# Patient Record
Sex: Female | Born: 1970 | Race: Black or African American | Hispanic: No | Smoking: Never smoker
Health system: Southern US, Community
[De-identification: ages and names within clinical notes are randomized; demographics above are authoritative.]

## PROBLEM LIST (undated history)

## (undated) DIAGNOSIS — H409 Unspecified glaucoma: Secondary | ICD-10-CM

## (undated) DIAGNOSIS — D649 Anemia, unspecified: Secondary | ICD-10-CM

## (undated) DIAGNOSIS — E739 Lactose intolerance, unspecified: Secondary | ICD-10-CM

## (undated) DIAGNOSIS — R0602 Shortness of breath: Secondary | ICD-10-CM

## (undated) DIAGNOSIS — Z9289 Personal history of other medical treatment: Secondary | ICD-10-CM

## (undated) DIAGNOSIS — R569 Unspecified convulsions: Secondary | ICD-10-CM

## (undated) DIAGNOSIS — R5383 Other fatigue: Secondary | ICD-10-CM

## (undated) HISTORY — DX: Shortness of breath: R06.02

## (undated) HISTORY — DX: Unspecified convulsions: R56.9

## (undated) HISTORY — DX: Lactose intolerance, unspecified: E73.9

## (undated) HISTORY — DX: Unspecified glaucoma: H40.9

## (undated) HISTORY — DX: Personal history of other medical treatment: Z92.89

## (undated) HISTORY — DX: Other fatigue: R53.83

## (undated) HISTORY — DX: Anemia, unspecified: D64.9

## (undated) HISTORY — PX: CHOLECYSTECTOMY: SHX55

---

## 2001-04-10 HISTORY — PX: BREAST ENHANCEMENT SURGERY: SHX7

## 2008-10-30 ENCOUNTER — Encounter (INDEPENDENT_AMBULATORY_CARE_PROVIDER_SITE_OTHER): Payer: Self-pay | Admitting: Obstetrics and Gynecology

## 2008-10-30 ENCOUNTER — Ambulatory Visit (HOSPITAL_COMMUNITY): Admission: RE | Admit: 2008-10-30 | Discharge: 2008-10-30 | Payer: Self-pay | Admitting: Obstetrics and Gynecology

## 2010-07-17 LAB — BASIC METABOLIC PANEL
CO2: 27 mEq/L (ref 19–32)
GFR calc non Af Amer: >60 mL/min (ref >60)
Glucose, Bld: 86 mg/dL (ref 70–99)
Potassium: 4 mEq/L (ref 3.5–5.1)
Sodium: 139 mEq/L (ref 135–145)

## 2010-07-17 LAB — CBC
HCT: 35.4 % — ABNORMAL LOW (ref 36.0–46.0)
Hemoglobin: 11.9 g/dL — ABNORMAL LOW (ref 12.0–15.0)
MCHC: 33.5 g/dL (ref 30.0–36.0)
RDW: 15.3 % (ref 11.5–15.5)

## 2010-07-17 LAB — APTT: aPTT: 30 seconds (ref 24–37)

## 2010-08-23 NOTE — Op Note (Signed)
NAME:  Beth Morgan, Beth Morgan NO.:  [id number]   MEDICAL RECORD NO.:  [id number]          PATIENT TYPE:  AMB   LOCATION:  SDC                           FACILITY:  [facility]   PHYSICIAN:  Miguel Aschoff, M.D.       DATE OF BIRTH:  [birth year]   DATE OF PROCEDURE:  10/30/2008  DATE OF DISCHARGE:  10/30/2008                               OPERATIVE REPORT   PREOPERATIVE DIAGNOSIS:  Menorrhagia.   POSTOPERATIVE DIAGNOSIS:  Menorrhagia.   PROCEDURE:  Cervical dilatation, hysteroscopy, uterine curettage,  followed by NovaSure endometrial ablation.   SURGEON:  Miguel Aschoff, MD   ANESTHESIA:  General.   COMPLICATIONS:  None.   JUSTIFICATION:  The patient is a 40 year old black female with history  of progressively heavier menses, now interfering with ability to carry  out with normal routines of life.  Because of this heavy bleeding, she  has requested the procedure to be carried out and effort to control the  flow and after going counseling to the risks and benefits of endometrial  ablation versus other options of medical therapy, the patient has  elected to proceed with endometrial ablation.  The risks and benefits  have been discussed.  Informed consent has been obtained.   DESCRIPTION OF PROCEDURE:  The patient was taken to the operating room,  placed in supine position, general anesthesia was administered without  difficulty.  Once this was done, she was placed in dorsal lithotomy  position, prepped and draped in the usual sterile fashion.  Once this  was done, examination under anesthesia was carried out, which revealed  normal external genitalia, normal Bartholin and Skene glands, normal  urethra, and the vaginal vault was without gross lesion.  The cervix was  without gross lesion.  Uterus noted be anterior, normal size, and shape.  No adnexal masses were noted.  The bladder was previously catheterized.  Once this was done, a speculum was placed in the vaginal vault.  Anterior cervical lip was grasped with tenaculum and then the uterus was  sounded to 9 cm.  Cervical length was then determined to be 4 cm for  cavity length of 5.  At this point, further dilatation was carried out  using serial Pratt dilators until a #23 Pratt dilator could be passed.  Once this was done, the diagnostic hysteroscope was advanced through the  endocervical canal.  There is no endocervical lesions were noted.  The  endometrial cavity was inspected.  There were no polyps or submucous  myomas noted.  The cavity appeared essentially within normal limits.  Tubal ostia appeared to be within normal limits.  At this point, the  hysteroscope was removed.  Vigorous sharp curettage was carried out.  The curettings were sent for histologic study.  The NovaSure endometrial  ablation unit was then placed into uterine cavity.  The array opened and  a cavity width of 3.6 cm was determined.  At this point, a treatment  cycle was carried out at 99 watts for 90 seconds.  On the completion of  the treatment cycle, the unit was removed intact.  The hysteroscope  was  replaced into uterine cavity and cavity appeared be completely  coagulated.  At this point, the hysteroscope was removed.  The cervix  was injected with 10 mL of 1% Xylocaine for analgesia.  The patient  reversed from anesthetic and taken to recovery room in satisfactory  condition. Estimated blood loss from the procedure was approximately 30  mL.   PLAN:  The patient to be discharged home.   MEDICATIONS FOR HOME:  1. Doxycycline 1 twice a day x3 days.  2. Darvocet-N 100 every 4 hours as need for pain.   The patient will be seen back in 4 weeks for followup examination.  She  is to call for any problems such as fever, pain, or heavy bleeding.      Miguel Aschoff, M.D.  Electronically Signed     AR/MEDQ  D:  10/30/2008  T:  10/31/2008  Job:  [id number]

## 2012-10-16 ENCOUNTER — Ambulatory Visit (INDEPENDENT_AMBULATORY_CARE_PROVIDER_SITE_OTHER): Payer: PRIVATE HEALTH INSURANCE | Admitting: Internal Medicine

## 2012-10-16 ENCOUNTER — Encounter: Payer: Self-pay | Admitting: Internal Medicine

## 2012-10-16 VITALS — BP 110/72 | HR 56 | Temp 97.9°F | Ht 64.0 in | Wt 220.0 lb

## 2012-10-16 DIAGNOSIS — Z131 Encounter for screening for diabetes mellitus: Secondary | ICD-10-CM

## 2012-10-16 DIAGNOSIS — Z13220 Encounter for screening for lipoid disorders: Secondary | ICD-10-CM

## 2012-10-16 DIAGNOSIS — Z0000 Encounter for general adult medical examination without abnormal findings: Secondary | ICD-10-CM

## 2012-10-16 DIAGNOSIS — Z130 Encounter for screening for diseases of the blood and blood-forming organs and certain disorders involving the immune mechanism: Secondary | ICD-10-CM

## 2012-10-16 DIAGNOSIS — E669 Obesity, unspecified: Secondary | ICD-10-CM | POA: Insufficient documentation

## 2012-10-16 NOTE — Progress Notes (Signed)
HPI  Pt presents to the clinic today to establish care. She has not had a PCP in the last few years secondary to loss of insurance coverage. She has no concerns today.  Flu: never Tetanus: not UTD Eye doctor: annually Dentist: annually LMP: ablation 2011 Pap smear: 2011 Mammogram: never  Past Medical History  Diagnosis Date  . Seizures   . History of blood transfusion     No current outpatient prescriptions on file.   No current facility-administered medications for this visit.    No Known Allergies  Family History  Problem Relation Age of Onset  . Drug abuse Mother   . Asthma Maternal Aunt   . Bipolar disorder Maternal Aunt   . Drug abuse Maternal Uncle   . Fibromyalgia Maternal Uncle   . Bipolar disorder Maternal Uncle   . Throat cancer Maternal Grandmother   . Lung cancer Maternal Grandmother   . Throat cancer Maternal Grandfather   . Lung cancer Maternal Grandfather   . Hypertension Maternal Grandfather     History   Social History  . Marital Status: Single    Spouse Name: N/A    Number of Children: 2  . Years of Education: 12+   Occupational History  . [employer]    Social History Main Topics  . Smoking status: Never Smoker   . Smokeless tobacco: Never Used  . Alcohol Use: Yes  . Drug Use: No  . Sexually Active: Not on file   Other Topics Concern  . Not on file   Social History Narrative   Regular exercise-yes   Caffeine Use-yes    ROS:  Constitutional: Denies fever, malaise, fatigue, headache or abrupt weight changes.  HEENT: Denies eye pain, eye redness, ear pain, ringing in the ears, wax buildup, runny nose, nasal congestion, bloody nose, or sore throat. Respiratory: Denies difficulty breathing, shortness of breath, cough or sputum production.   Cardiovascular: Denies chest pain, chest tightness, palpitations or swelling in the hands or feet.  Gastrointestinal: Denies abdominal pain, bloating, constipation, diarrhea or blood in the stool.   GU: Denies frequency, urgency, pain with urination, blood in urine, odor or discharge. Musculoskeletal: Denies decrease in range of motion, difficulty with gait, muscle pain or joint pain and swelling.  Skin: Denies redness, rashes, lesions or ulcercations.  Neurological: Denies dizziness, difficulty with memory, difficulty with speech or problems with balance and coordination.   No other specific complaints in a complete review of systems (except as listed in HPI above).  PE:  BP 110/72  Pulse 56  Temp(Src) 97.9 F (36.6 C) (Oral)  Ht 5\' 4"  (1.626 m)  Wt 220 lb (99.791 kg)  BMI 37.74 kg/m2  SpO2 99% Wt Readings from Last 3 Encounters:  10/16/12 220 lb (99.791 kg)    General: Appears her stated age, obese but well developed, well nourished in NAD. HEENT: Head: normal shape and size; Eyes: sclera white, no icterus, conjunctiva pink, PERRLA and EOMs intact; Ears: Tm's gray and intact, normal light reflex; Nose: mucosa pink and moist, septum midline; Throat/Mouth: Teeth present, mucosa pink and moist, no lesions or ulcerations noted.  Neck: Normal range of motion. Neck supple, trachea midline. No massses, lumps or thyromegaly present.  Cardiovascular: Normal rate and rhythm. S1,S2 noted.  No murmur, rubs or gallops noted. No JVD or BLE edema. No carotid bruits noted. Pulmonary/Chest: Normal effort and positive vesicular breath sounds. No respiratory distress. No wheezes, rales or ronchi noted.  Abdomen: Soft and nontender. Normal bowel sounds,  no bruits noted. No distention or masses noted. Liver, spleen and kidneys non palpable. Musculoskeletal: Normal range of motion. No signs of joint swelling. No difficulty with gait.  Neurological: Alert and oriented. Cranial nerves II-XII intact. Coordination normal. +DTRs bilaterally. Psychiatric: Mood and affect normal. Behavior is normal. Judgment and thought content normal.      Assessment and Plan:  Preventative Health Maintenance:  Pt  declines tdap today Pap smear scheduled for next month Will have mammogram ordered through gyn Start to work on diet and exercise Will obtain labs today

## 2012-10-16 NOTE — Assessment & Plan Note (Signed)
Encouraged pt to download and utilize myfitnesspal app If not effective, RTC in 2 month to discuss other weight loss modalaties

## 2012-10-16 NOTE — Patient Instructions (Signed)

## 2012-10-26 LAB — CBC AND DIFFERENTIAL
HCT: 36 % (ref 36–46)
Hemoglobin: 11.7 g/dL — AB (ref 12.0–16.0)

## 2012-10-26 LAB — HEPATIC FUNCTION PANEL
Alkaline Phosphatase: 67 U/L (ref 25–125)
Bilirubin, Total: 0.4 mg/dL

## 2012-10-26 LAB — LIPID PANEL
HDL: 55 mg/dL (ref 35–70)
LDL Cholesterol: 120 mg/dL
Triglycerides: 113 mg/dL (ref 40–160)

## 2012-10-26 LAB — BASIC METABOLIC PANEL: Glucose: 84 mg/dL

## 2012-11-22 ENCOUNTER — Other Ambulatory Visit: Payer: Self-pay | Admitting: Obstetrics & Gynecology

## 2012-11-26 ENCOUNTER — Encounter: Payer: Self-pay | Admitting: Internal Medicine

## 2012-11-28 ENCOUNTER — Other Ambulatory Visit: Payer: Self-pay | Admitting: Obstetrics & Gynecology

## 2012-11-28 DIAGNOSIS — R928 Other abnormal and inconclusive findings on diagnostic imaging of breast: Secondary | ICD-10-CM

## 2012-12-25 ENCOUNTER — Ambulatory Visit
Admission: RE | Admit: 2012-12-25 | Discharge: 2012-12-25 | Disposition: A | Discharge status: Home / Self Care | Payer: PRIVATE HEALTH INSURANCE | Source: Ambulatory Visit | Attending: Obstetrics & Gynecology | Admitting: Obstetrics & Gynecology

## 2012-12-25 DIAGNOSIS — R928 Other abnormal and inconclusive findings on diagnostic imaging of breast: Secondary | ICD-10-CM

## 2014-07-26 IMAGING — MG MM DIANOSTIC UNILATERAL R
2 series · 2 of 2 positions shown · non-contrast
Comparison: None.

CLINICAL DATA: The patient returns for evaluation of a possible
asymmetry in the right upper outer quadrant posteriorly.

DIGITAL DIAGNOSTIC RIGHT MAMMOGRAM

[R CC]
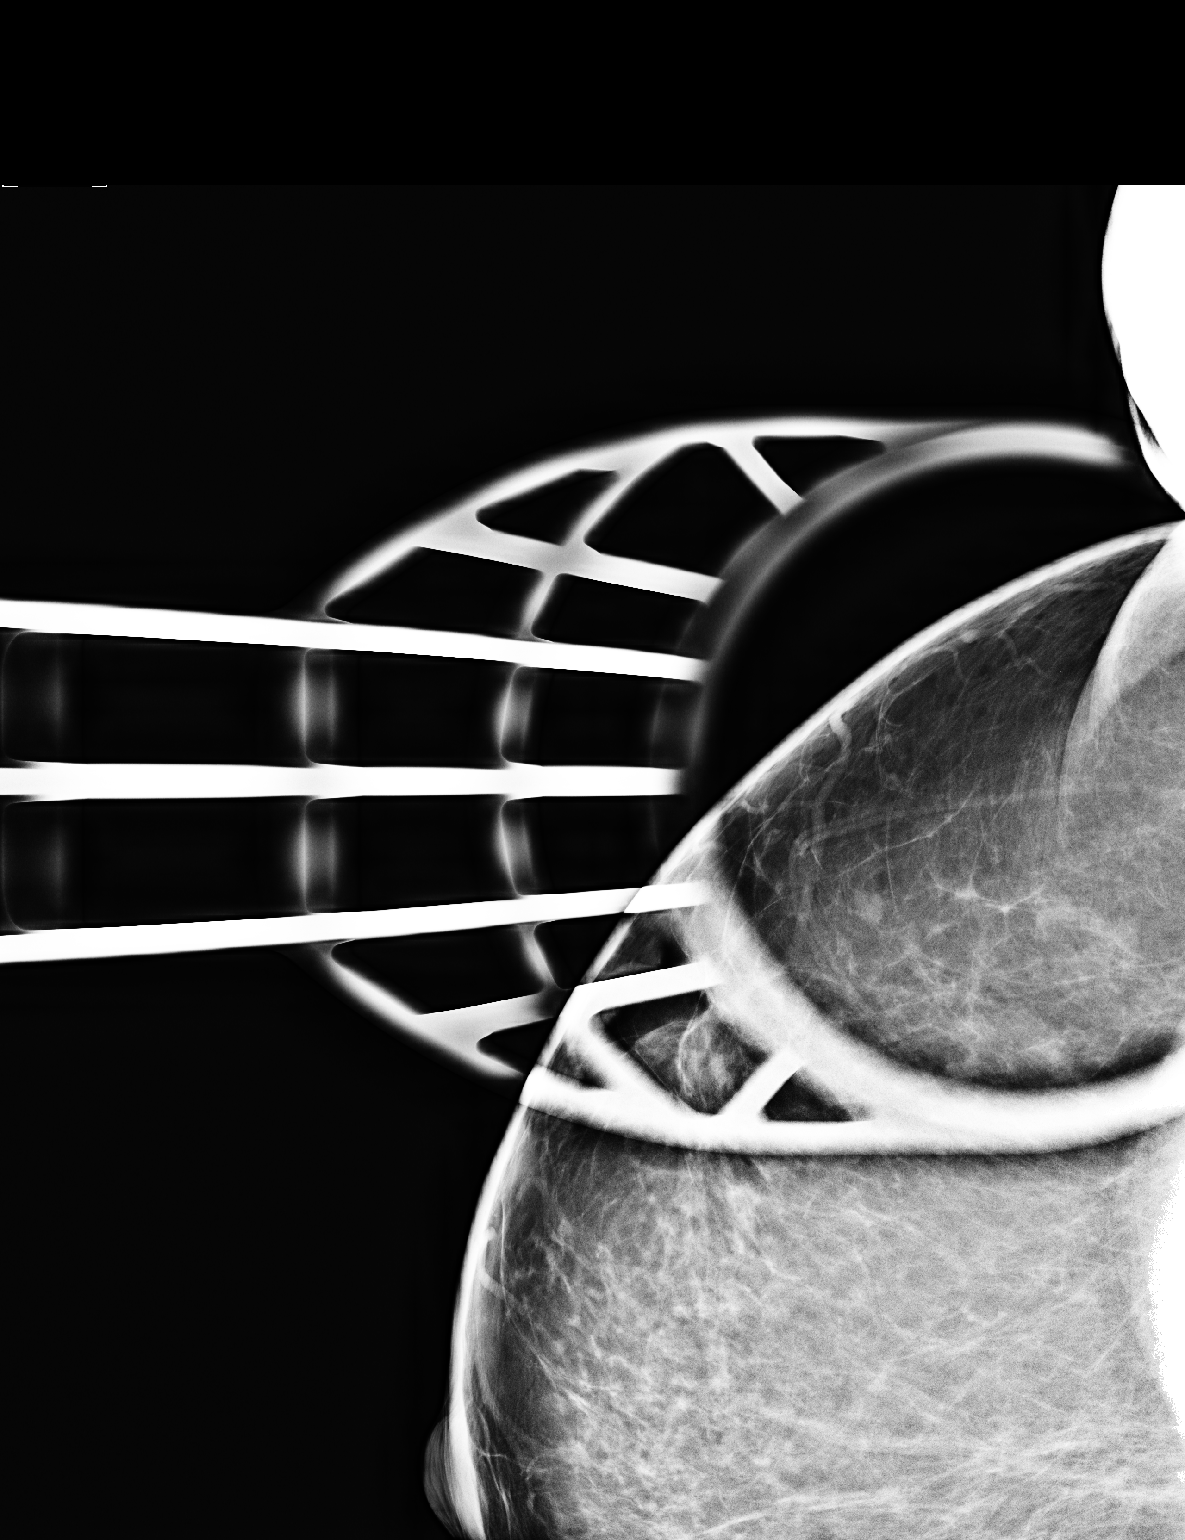

[R MLO]
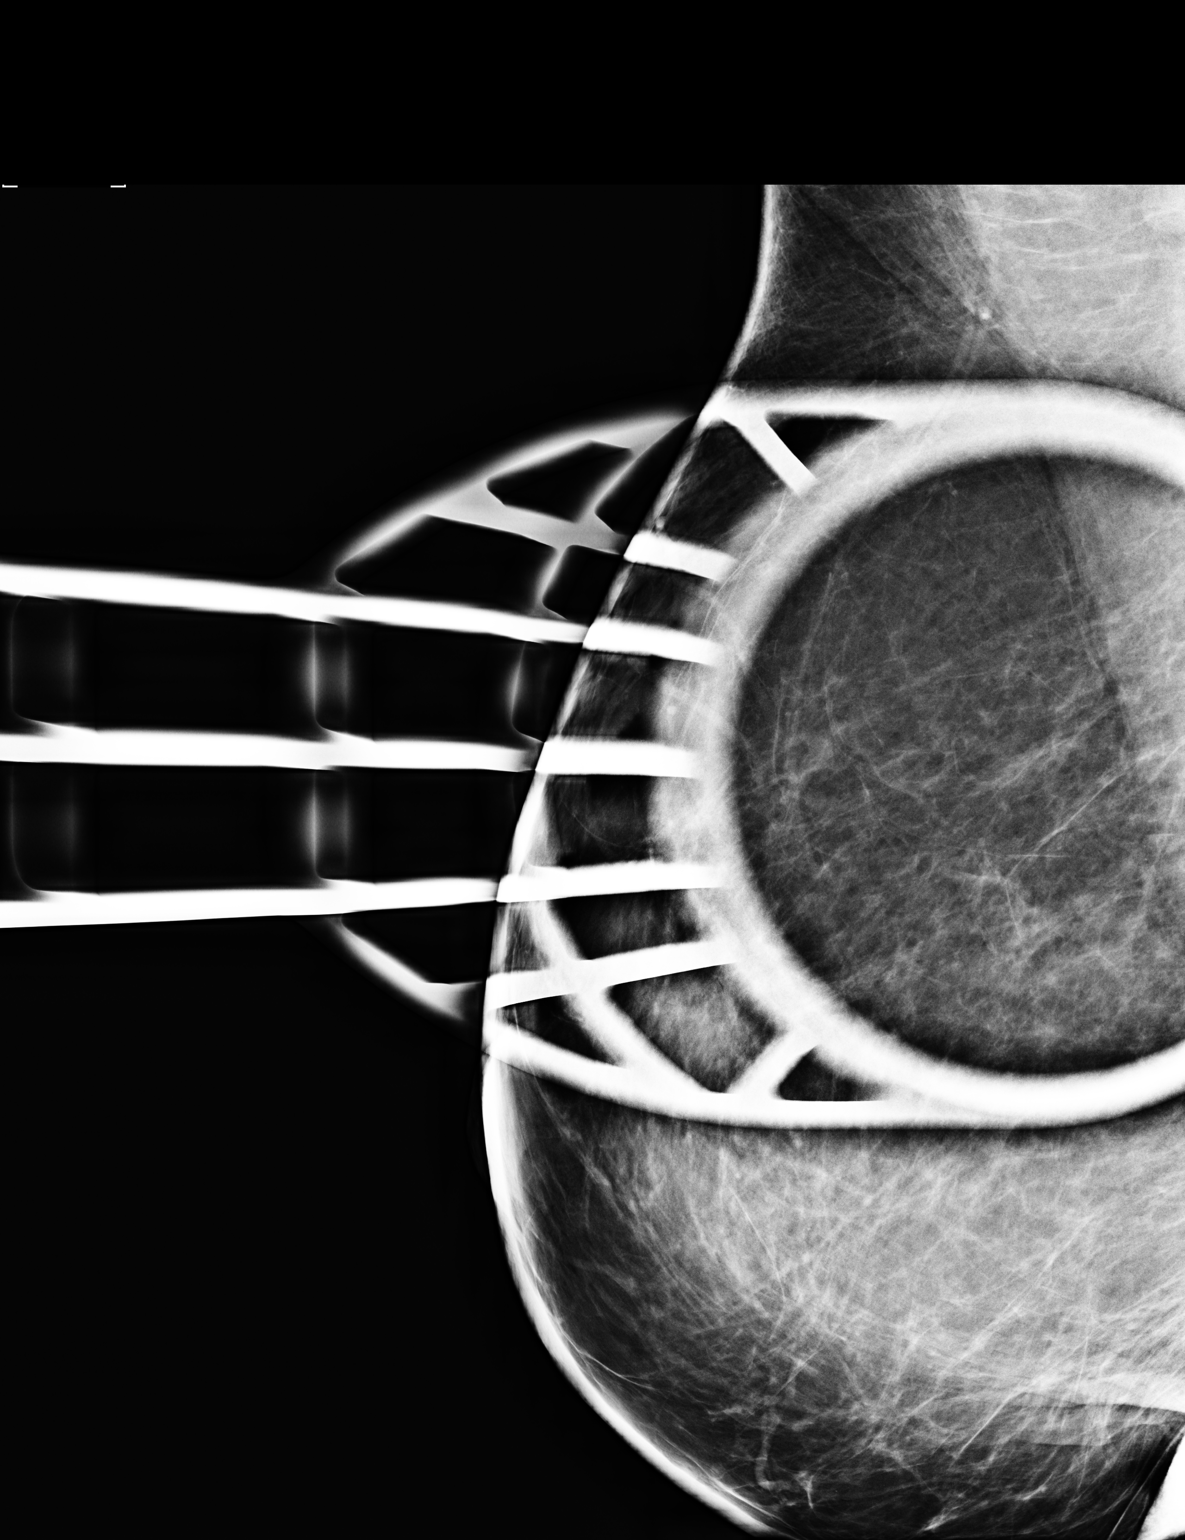

[2 of 2 positions shown; findings below may reference images not displayed]

FINDINGS: ACR Breast Density Category a:  The breast tissue is almost
entirely fatty.

Additional views demonstrate no persistent worrisome mass or
distortion of the right upper outer quadrant.  The initial finding
is felt to be due to reduction mammoplasty change.
IMPRESSION: No persistent worrisome abnormality upon additional imaging of the
right breast.

RECOMMENDATION:
Yearly screening mammography is suggested.

I have discussed the findings and recommendations with the patient.
Results were also provided in writing at the conclusion of the
visit.  If applicable, a reminder letter will be sent to the
patient regarding her next appointment.

BI-RADS CATEGORY 1:  Negative.

## 2015-04-06 ENCOUNTER — Other Ambulatory Visit: Payer: Self-pay | Admitting: Obstetrics & Gynecology

## 2015-04-09 LAB — CYTOLOGY - PAP

## 2019-07-18 ENCOUNTER — Ambulatory Visit: Payer: PRIVATE HEALTH INSURANCE | Attending: Internal Medicine

## 2019-07-18 DIAGNOSIS — Z23 Encounter for immunization: Secondary | ICD-10-CM

## 2019-07-18 NOTE — Progress Notes (Signed)
   Covid-19 Vaccination Clinic  Name:  Beth Morgan    MRN: [id number] DOB: February 27, 1971  07/18/2019  [name] was observed post Covid-19 immunization for 15 minutes without incident. She was provided with Vaccine Information Sheet and instruction to access the V-Safe system.   [name] was instructed to call 911 with any severe reactions post vaccine: Marland Kitchen Difficulty breathing  . Swelling of face and throat  . A fast heartbeat  . A bad rash all over body  . Dizziness and weakness   Immunizations Administered    Name Date Dose VIS Date Route   Pfizer COVID-19 Vaccine 07/18/2019  4:31 PM 0.3 mL 03/21/2019 Intramuscular   Manufacturer: [employer], [employer]   Lot: [id number]   NDC: [address]-6

## 2019-08-11 ENCOUNTER — Ambulatory Visit: Payer: PRIVATE HEALTH INSURANCE | Attending: Internal Medicine

## 2019-08-11 DIAGNOSIS — Z23 Encounter for immunization: Secondary | ICD-10-CM

## 2019-08-11 NOTE — Progress Notes (Signed)
   Covid-19 Vaccination Clinic  Name:  SACORA HAWBAKER    MRN: [id number] DOB: [birth year]  08/11/2019  Ms. Ellington was observed post Covid-19 immunization for 15 minutes without incident. She was provided with Vaccine Information Sheet and instruction to access the V-Safe system.   Ms. Kindley was instructed to call 911 with any severe reactions post vaccine: Marland Kitchen Difficulty breathing  . Swelling of face and throat  . A fast heartbeat  . A bad rash all over body  . Dizziness and weakness   Immunizations Administered    Name Date Dose VIS Date Route   Pfizer COVID-19 Vaccine 08/11/2019  4:33 PM 0.3 mL 06/04/2018 Intramuscular   Manufacturer: [employer], [employer]   Lot: [id number]   NDC: [address]-2

## 2021-03-21 DIAGNOSIS — Z0289 Encounter for other administrative examinations: Secondary | ICD-10-CM

## 2021-04-19 ENCOUNTER — Other Ambulatory Visit: Payer: Self-pay

## 2021-04-19 ENCOUNTER — Ambulatory Visit (INDEPENDENT_AMBULATORY_CARE_PROVIDER_SITE_OTHER): Payer: Managed Care, Other (non HMO) | Admitting: Family Medicine

## 2021-04-19 ENCOUNTER — Encounter (INDEPENDENT_AMBULATORY_CARE_PROVIDER_SITE_OTHER): Payer: Self-pay | Admitting: Family Medicine

## 2021-04-19 VITALS — BP 125/84 | HR 64 | Temp 98.2°F | Ht 64.0 in | Wt 229.0 lb

## 2021-04-19 DIAGNOSIS — E739 Lactose intolerance, unspecified: Secondary | ICD-10-CM

## 2021-04-19 DIAGNOSIS — E66812 Obesity, class 2: Secondary | ICD-10-CM

## 2021-04-19 DIAGNOSIS — Z1331 Encounter for screening for depression: Secondary | ICD-10-CM

## 2021-04-19 DIAGNOSIS — F5089 Other specified eating disorder: Secondary | ICD-10-CM

## 2021-04-19 DIAGNOSIS — Z9189 Other specified personal risk factors, not elsewhere classified: Secondary | ICD-10-CM

## 2021-04-19 DIAGNOSIS — E8881 Metabolic syndrome: Secondary | ICD-10-CM | POA: Diagnosis not present

## 2021-04-19 DIAGNOSIS — E88819 Insulin resistance, unspecified: Secondary | ICD-10-CM

## 2021-04-19 DIAGNOSIS — Z6839 Body mass index (BMI) 39.0-39.9, adult: Secondary | ICD-10-CM

## 2021-04-19 DIAGNOSIS — E559 Vitamin D deficiency, unspecified: Secondary | ICD-10-CM

## 2021-04-19 DIAGNOSIS — D649 Anemia, unspecified: Secondary | ICD-10-CM

## 2021-04-19 DIAGNOSIS — R0602 Shortness of breath: Secondary | ICD-10-CM

## 2021-04-19 DIAGNOSIS — R5383 Other fatigue: Secondary | ICD-10-CM | POA: Diagnosis not present

## 2021-04-19 DIAGNOSIS — E785 Hyperlipidemia, unspecified: Secondary | ICD-10-CM | POA: Diagnosis not present

## 2021-04-19 DIAGNOSIS — E669 Obesity, unspecified: Secondary | ICD-10-CM

## 2021-04-20 LAB — CBC WITH DIFFERENTIAL/PLATELET
Basophils Absolute: 0.1 10*3/uL (ref 0.0–0.2)
Basos: 1 %
EOS (ABSOLUTE): 0.1 10*3/uL (ref 0.0–0.4)
Eos: 1 %
Hematocrit: 39.1 % (ref 34.0–46.6)
Hemoglobin: 12.3 g/dL (ref 11.1–15.9)
Immature Grans (Abs): 0 10*3/uL (ref 0.0–0.1)
Immature Granulocytes: 0 %
Lymphocytes Absolute: 2.8 10*3/uL (ref 0.7–3.1)
Lymphs: 41 %
MCH: 25.9 pg — ABNORMAL LOW (ref 26.6–33.0)
MCHC: 31.5 g/dL (ref 31.5–35.7)
MCV: 83 fL (ref 79–97)
Monocytes Absolute: 0.3 10*3/uL (ref 0.1–0.9)
Monocytes: 5 %
Neutrophils Absolute: 3.6 10*3/uL (ref 1.4–7.0)
Neutrophils: 52 %
Platelets: 275 10*3/uL (ref 150–450)
RBC: 4.74 x10E6/uL (ref 3.77–5.28)
RDW: 13.6 % (ref 11.7–15.4)
WBC: 6.9 10*3/uL (ref 3.4–10.8)

## 2021-04-20 LAB — LIPID PANEL WITH LDL/HDL RATIO
Cholesterol, Total: 228 mg/dL — ABNORMAL HIGH (ref 100–199)
HDL: 52 mg/dL (ref >39)
LDL Chol Calc (NIH): 158 mg/dL — ABNORMAL HIGH (ref 0–99)
LDL/HDL Ratio: 3 ratio (ref 0.0–3.2)
Triglycerides: 101 mg/dL (ref 0–149)
VLDL Cholesterol Cal: 18 mg/dL (ref 5–40)

## 2021-04-20 LAB — COMPREHENSIVE METABOLIC PANEL
ALT: 18 IU/L (ref 0–32)
AST: 22 IU/L (ref 0–40)
Albumin/Globulin Ratio: 1.7 (ref 1.2–2.2)
Albumin: 4.3 g/dL (ref 3.8–4.9)
Alkaline Phosphatase: 104 IU/L (ref 44–121)
BUN/Creatinine Ratio: 10 (ref 9–23)
BUN: 8 mg/dL (ref 6–24)
Bilirubin Total: 0.3 mg/dL (ref 0.0–1.2)
CO2: 24 mmol/L (ref 20–29)
Calcium: 9.4 mg/dL (ref 8.7–10.2)
Chloride: 104 mmol/L (ref 96–106)
Creatinine, Ser: 0.84 mg/dL (ref 0.57–1.00)
Globulin, Total: 2.5 g/dL (ref 1.5–4.5)
Glucose: 79 mg/dL (ref 70–99)
Potassium: 4.2 mmol/L (ref 3.5–5.2)
Sodium: 141 mmol/L (ref 134–144)
Total Protein: 6.8 g/dL (ref 6.0–8.5)
eGFR: 84 mL/min/{1.73_m2} (ref >59)

## 2021-04-20 LAB — VITAMIN B12: Vitamin B-12: 330 pg/mL (ref 232–1245)

## 2021-04-20 LAB — VITAMIN D 25 HYDROXY (VIT D DEFICIENCY, FRACTURES): Vit D, 25-Hydroxy: 5.9 ng/mL — ABNORMAL LOW (ref 30.0–100.0)

## 2021-04-20 LAB — HEMOGLOBIN A1C
Est. average glucose Bld gHb Est-mCnc: 117 mg/dL
Hgb A1c MFr Bld: 5.7 % — ABNORMAL HIGH (ref 4.8–5.6)

## 2021-04-20 LAB — FOLATE: Folate: 6.9 ng/mL (ref >3.0)

## 2021-04-20 LAB — INSULIN, RANDOM: INSULIN: 17 u[IU]/mL (ref 2.6–24.9)

## 2021-04-20 LAB — T4, FREE: Free T4: 1.24 ng/dL (ref 0.82–1.77)

## 2021-04-20 LAB — TSH: TSH: 2.73 u[IU]/mL (ref 0.450–4.500)

## 2021-04-24 NOTE — Progress Notes (Signed)
Chief Complaint:   OBESITY Hazle QuantDanica L Moline (MR# [id number]) is a 51 y.o. female who presents for evaluation and treatment of obesity and related comorbidities. Current BMI is Body mass index is 39.31 kg/m. Nicholes MangoDanica has been struggling with her weight for many years and has been unsuccessful in either losing weight, maintaining weight loss, or reaching her healthy weight goal.  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 She has an 52 year old daughter that lives with her. She desires an 80 lb weight loss in 1 year. She feels that eating out is her worst habit that led to her weight gain, as she eats out every day. She snacks on popcorn, chips, and cookies. She skips lunch 4 days per week. Her [facility] care [facility] [place], which I do not have any records from and they are not in the EMR.  Nicholes MangoDanica is currently in the action stage of change and ready to dedicate time achieving and maintaining a healthier weight. Nicholes MangoDanica is interested in becoming our patient and working on intensive lifestyle modifications including (but not limited to) diet and exercise for weight loss.  Jamar's habits were reviewed today and are as follows: Her family eats meals together, she thinks her family will eat healthier with her, her desired weight loss is 69 lbs, she has been heavy most of her life, she started gaining weight at 18 after her first child, her heaviest weight ever was 240 pounds, she snacks frequently in the evenings, she frequently makes poor food choices, she frequently eats larger portions than normal, and she struggles with emotional eating.  Depression Screen Jaylee's Food and Mood (modified PHQ-9) score was 8.  Depression screen PHQ 2/9 04/19/2021  Decreased Interest 1  Down, Depressed, Hopeless 1  PHQ - 2 Score 2  Altered sleeping 1  Tired, decreased energy 1  Change in appetite 1  Feeling bad or failure about yourself  1  Trouble  concentrating 0  Moving slowly or fidgety/restless 2  Suicidal thoughts 0  PHQ-9 Score 8  Difficult doing work/chores Not difficult at all   Subjective:   1. Other fatigue Deolinda admits to daytime somnolence and admits to waking up still tired. Patent has a history of symptoms of daytime fatigue and morning fatigue. Orvetta generally gets 7 or 8 hours of sleep per night, and states that she has nightime awakenings. Snoring is not present. Apneic episodes are not present. Epworth Sleepiness Score is 5.  2. Shortness of breath on exertion Leesa notes increasing shortness of breath with exercising and seems to be worsening over time with weight gain. She notes getting out of breath sooner with activity than she used to. This has not gotten worse recently. Tagan denies shortness of breath at rest or orthopnea.  3. Anemia, unspecified type Nakkia doesn't take any supplements but she was told to in the past.  4. Lactose intolerance Richele diagnosed herself. She notes milk and cheese causes her to have "bad gas", and occasional loose stool.  5. Hyperlipidemia, unspecified hyperlipidemia type Nicholes MangoDanica was told she had elevated LDL in the past. She has not been on medications in the past.  6. Insulin resistance Jolee's A1c was 5.6 eight years ago. She has no recent A1c, and she is not on medications.  7. Vitamin D deficiency Lakiyah was diagnosed years ago. She was told to take OTC Vit D, but she stopped. She never had it rechecked.  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 She claims this is not a current road block for her and she declines a referral to see Dr. Dewaine Conger. Her PHQ-9 score is only 8.   9. At risk for impaired metabolic function Ladana is at increased risk for impaired metabolic function due to obesity and insulin resistance.   Assessment/Plan:   Orders Placed This Encounter  Procedures   Hemoglobin  A1c   Folate   Comprehensive metabolic panel   CBC with Differential/Platelet   Vitamin B12   Insulin, random   Lipid Panel With LDL/HDL Ratio   T4, free   TSH   VITAMIN D 25 Hydroxy (Vit-D Deficiency, Fractures)   EKG 12-Lead    There are no discontinued medications.   No orders of the defined types were placed in this encounter.    1. Other fatigue Clelia does feel that her weight is causing her energy to be lower than it should be. Fatigue may be related to obesity, depression or many other causes. Labs will be ordered, and in the meanwhile, Adilene will focus on self care including making healthy food choices, increasing physical activity and focusing on stress reduction.  - EKG 12-Lead  2. Shortness of breath on exertion Sofija does feel that she gets out of breath more easily that she used to when she exercises. Ally's shortness of breath appears to be obesity related and exercise induced. She has agreed to work on weight loss and gradually increase exercise to treat her exercise induced shortness of breath. Will continue to monitor closely.  3. Anemia, unspecified type We will check labs today, and we will follow up at Narissa's next office visit. Orders and follow up as documented in patient record.  Counseling Iron is essential for our bodies to make red blood cells.  Reasons that someone may be deficient include: an iron-deficient diet (more likely in those following vegan or vegetarian diets), women with heavy menses, patients with GI disorders or poor absorption, patients that have had bariatric surgery, frequent blood donors, patients with cancer, and patients with heart disease.   Iron-rich foods include dark leafy greens, red and white meats, eggs, seafood, and beans.   Certain foods and drinks prevent your body from absorbing iron properly. Avoid eating these foods in the same meal as iron-rich foods or with iron supplements. These foods include: coffee, black tea, and  red wine; milk, dairy products, and foods that are high in calcium; beans and soybeans; whole grains.  Constipation can be a side effect of iron supplementation. Increased water and fiber intake are helpful. Water goal: > 2 liters/day. Fiber goal: > 25 grams/day.  - Folate - CBC with Differential/Platelet - Vitamin B12  4. Lactose intolerance I discussed with the patient lactose free milk, yogurts, cheese, and substitutions that she can make.  5. Hyperlipidemia, unspecified hyperlipidemia type Cardiovascular risk and specific lipid/LDL goals reviewed. We discussed several lifestyle modifications today. We will check labs today. Chaquetta will continue to work on diet, exercise and weight loss efforts. Orders and follow up as documented in patient record.   Counseling Intensive lifestyle modifications are the first line treatment for this issue. Dietary changes: Increase soluble fiber. Decrease simple carbohydrates. Exercise changes: Moderate to vigorous-intensity aerobic activity 150 minutes per week if tolerated. Lipid-lowering medications: see documented in medical record.  - Comprehensive metabolic panel - Lipid Panel With LDL/HDL Ratio  6. Insulin resistance Renatha will  continue to work on weight loss, exercise, and decreasing simple carbohydrates to help decrease the risk of diabetes. We will check labs today. Kennesha agreed to follow-up with [place] as directed to closely monitor her progress.  - Hemoglobin A1c - Insulin, random - T4, free - TSH  7. Vitamin D deficiency Low Vitamin D level contributes to fatigue and are associated with obesity, breast, and colon cancer. We will check labs today. Sidney will follow-up for routine testing of Vitamin D, at least 2-3 times per year to avoid over-replacement.  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 Orders and follow up as documented in patient record.   9. Screening for depression Alithia had a positive depression screening. Depression is commonly associated with obesity and often results in emotional eating behaviors. We will monitor this closely and work on CBT to help improve the non-hunger eating patterns. Referral to Psychology may be required if no improvement is seen as she continues in our clinic.  10. At risk for impaired metabolic function Aveen was given approximately 23 minutes of impaired  metabolic function prevention counseling today. We discussed intensive lifestyle modifications today with an emphasis on specific nutrition and exercise instructions and strategies.   Repetitive spaced learning was employed today to elicit superior memory formation and behavioral change.   11. Obesity with current BMI of 39.3 Aamilah is currently in the action stage of change and her goal is to continue with weight loss efforts. I recommend Abrianna begin the structured treatment plan as follows:  She has agreed to the Category 1 Plan.  Exercise goals: As is.   Behavioral modification strategies: increasing lean protein intake, decreasing simple carbohydrates, decreasing eating out, and planning for success.  She was informed of the importance of frequent follow-up visits to maximize her success with intensive lifestyle modifications for her multiple health conditions. She was informed we would discuss her lab results at her next visit unless there is a critical issue that needs to be addressed sooner. Arnesha agreed to keep her next visit at the agreed upon time to discuss these results.  Objective:   Blood pressure 125/84, pulse 64, temperature 98.2 F (36.8 C), height 5\' 4"  (1.626 m), weight 229 lb (103.9 kg), SpO2 99 %. Body mass index is 39.31 kg/m.  EKG: Normal sinus rhythm, rate 65 BPM.  Indirect Calorimeter completed today shows a VO2 of 229 and a REE of  1584.  Her calculated basal metabolic rate is thus her basal metabolic rate is worse than expected.  General: Cooperative, alert, well developed, in no acute distress. HEENT: Conjunctivae and lids unremarkable. Cardiovascular: Regular rhythm.  Lungs: Normal work of breathing. Neurologic: No focal deficits.   Lab Results  Component Value Date   CREATININE 0.84 04/19/2021   BUN 8 04/19/2021   NA 141 04/19/2021   K 4.2 04/19/2021   CL 104 04/19/2021   CO2 24 04/19/2021   Lab Results  Component Value Date   ALT 18 04/19/2021   AST 22 04/19/2021   ALKPHOS 104 04/19/2021   BILITOT 0.3 04/19/2021   Lab Results  Component Value Date   HGBA1C 5.7 (H) 04/19/2021   HGBA1C 5.6 10/26/2012   Lab Results  Component Value Date   INSULIN 17.0 04/19/2021   Lab Results  Component Value Date   TSH 2.730  04/19/2021   Lab Results  Component Value Date   CHOL 228 (H) 04/19/2021   HDL 52 04/19/2021   LDLCALC 158 (H) 04/19/2021   TRIG 101 04/19/2021   Lab Results  Component Value Date   WBC 6.9 04/19/2021   HGB 12.3 04/19/2021   HCT 39.1 04/19/2021   MCV 83 04/19/2021   PLT 275 04/19/2021   No results found for: IRON, TIBC, FERRITIN  Attestation Statements:   Reviewed by clinician on day of visit: allergies, medications, problem list, medical history, surgical history, family history, social history, and previous encounter notes.   Trude McburneyI, Sharon Martin, am acting as transcriptionist for Marsh & McLennanDeborah Norris Brumbach, DO  I have reviewed the above documentation for accuracy and completeness, and I agree with the above. Carlye Grippe- Shamarr Faucett J Cardell Rachel, D.O.  The 21st Century Cures Act was signed into law in 2016 which includes the topic of electronic health records.  This provides immediate access to information in MyChart.  This includes consultation notes, operative notes, office notes, lab results and pathology reports.  If you have any questions about what you read please let us know at your next  visit so we can discuss your concerns and take corrective action if need be.  We are right here with you.

## 2021-05-03 ENCOUNTER — Ambulatory Visit (INDEPENDENT_AMBULATORY_CARE_PROVIDER_SITE_OTHER): Payer: Managed Care, Other (non HMO) | Admitting: Family Medicine

## 2021-05-03 ENCOUNTER — Encounter (INDEPENDENT_AMBULATORY_CARE_PROVIDER_SITE_OTHER): Payer: Self-pay | Admitting: Family Medicine

## 2021-05-03 ENCOUNTER — Other Ambulatory Visit: Payer: Self-pay

## 2021-05-03 VITALS — BP 138/84 | HR 83 | Temp 98.3°F | Ht 64.0 in | Wt 230.0 lb

## 2021-05-03 DIAGNOSIS — E7849 Other hyperlipidemia: Secondary | ICD-10-CM

## 2021-05-03 DIAGNOSIS — E559 Vitamin D deficiency, unspecified: Secondary | ICD-10-CM

## 2021-05-03 DIAGNOSIS — Z9189 Other specified personal risk factors, not elsewhere classified: Secondary | ICD-10-CM

## 2021-05-03 DIAGNOSIS — D538 Other specified nutritional anemias: Secondary | ICD-10-CM

## 2021-05-03 DIAGNOSIS — R7303 Prediabetes: Secondary | ICD-10-CM | POA: Diagnosis not present

## 2021-05-03 DIAGNOSIS — E669 Obesity, unspecified: Secondary | ICD-10-CM

## 2021-05-03 DIAGNOSIS — Z6839 Body mass index (BMI) 39.0-39.9, adult: Secondary | ICD-10-CM

## 2021-05-03 MED ORDER — VITAMIN D (ERGOCALCIFEROL) 1.25 MG (50000 UNIT) PO CAPS: 50000.0000 [IU] | ORAL_CAPSULE | ORAL | 0 refills | Status: AC

## 2021-05-03 NOTE — Patient Instructions (Signed)
The 10-year ASCVD risk score (Arnett DK, et al., 2019) is: 3.8%   Values used to calculate the score:     Age: 51 years     Sex: Female     Is Non-Hispanic African American: Yes     Diabetic: No     Tobacco smoker: No     Systolic Blood Pressure: 138 mmHg     Is BP treated: No     HDL Cholesterol: 52 mg/dL     Total Cholesterol: 228 mg/dL

## 2021-05-04 NOTE — Progress Notes (Signed)
Chief Complaint:   OBESITY Beth Morgan is here to discuss her progress with her obesity treatment plan along with follow-up of her obesity related diagnoses. Beth Morgan is on the Category 1 Plan and states she is following her eating plan approximately 15% of the time. Beth Morgan states she is not currently exercising.  Today's visit was #: 2 Starting weight: 229 lbs Starting date: 04/19/2021 Today's weight: 230 lbs Today's date: 05/03/2021 Total lbs lost to date: 0 Total lbs lost since last in-office visit: +1  Interim History: Beth Morgan is here today for her first follow-up office visit since starting the program with [place].  All blood work/ lab tests that were recently ordered by myself or an outside provider were reviewed with patient today per their request.   Extended time was spent counseling her on all new disease processes that were discovered or preexisting ones that are affected by BMI.  she understands that many of these abnormalities will need to monitored regularly along with the current treatment plan of prudent dietary changes, in which we are making each and every office visit, to improve these health parameters. Pt hasn't been able to follow plan, as she traveled to [place], [place] and had her birthday. She hasn't shopped for food yet but plans to go this weekend. Pt only gained 1 lb over her birthday!  Subjective:   1. Other hyperlipidemia Discussed labs with patient today. Medication: None  The 10-year ASCVD risk score (Arnett DK, et al., 2019) is: 3.8%   Values used to calculate the score:     Age: 51 years     Sex: Female     Is Non-Hispanic African American: Yes     Diabetic: No     Tobacco smoker: No     Systolic Blood Pressure: 138 mmHg     Is BP treated: No     HDL Cholesterol: 52 mg/dL     Total Cholesterol: 228 mg/dL  2. Other specified nutritional anemias Discussed labs with patient today. Pt hasn't been on an iron supplement for months. She reports  fatigue.  3. Prediabetes New. Discussed labs with patient today. Pt has an elevated A1c for the first time and elevated fasting insulin to over 3 times its normal.  4. Vitamin D deficiency Discussed labs with patient today. She is currently taking no vitamin D supplement. She denies nausea, vomiting or muscle weakness.  5. At risk for deficient knowledge of diabetes mellitus Beth Morgan is at risk of deficient knowledge of diabetes mellitus due to history of insulin resistance that has now progressed to pre-diabetes.  Assessment/Plan:  No orders of the defined types were placed in this encounter.   There are no discontinued medications.   Meds ordered this encounter  Medications   Vitamin D, Ergocalciferol, (DRISDOL) 1.25 MG (50000 UNIT) CAPS capsule    Sig: Take 1 capsule (50,000 Units total) by mouth every 7 (seven) days.    Dispense:  4 capsule    Refill:  0    30 d supply;  ** OV for RF **   Do not send RF request     1. Other hyperlipidemia No need for meds at this time. Follow prudent nutritional plan, decrease sat/trans fats, and eventually increase exercise.  2. Other specified nutritional anemias Labs stable. No need for iron supplementation, however, iron rich foods are recommended.  3. Prediabetes Extensive counseling done and handouts provided. Follow prudent nutritional plan and decrease simple carbs. Repeat labs in 3 months.  4. Vitamin D deficiency Very poorly controlled. Plan: - Discussed importance of vitamin D to their health and well-being.  - possible symptoms of low Vitamin D can be low energy, depressed mood, muscle aches, joint aches, osteoporosis etc. - low Vitamin D levels may be linked to an increased risk of cardiovascular events and even increased risk of cancers- such as colon and breast.  - I recommend pt take a 50K IU weekly prescription vit D - see script below   - Informed patient this may be a lifelong thing, and she was encouraged to continue  to take the medicine until told otherwise.   - we will need to monitor levels regularly (every 3-4 mo on average) to keep levels within normal limits.  - weight loss will likely improve availability of vitamin D, thus encouraged Beth Morgan to continue with meal plan and their weight loss efforts to further improve this condition - pt's questions and concerns regarding this condition addressed.  Start- Vitamin D, Ergocalciferol, (DRISDOL) 1.25 MG (50000 UNIT) CAPS capsule; Take 1 capsule (50,000 Units total) by mouth every 7 (seven) days.  Dispense: 4 capsule; Refill: 0  5. At risk for deficient knowledge of diabetes mellitus Beth Morgan was given approximately 23 minutes of diabetes education and counseling today. We discussed intensive lifestyle modifications today with an emphasis on weight loss as well as increasing exercise and decreasing simple carbohydrates in her diet. We also reviewed medication options with an emphasis on risk versus benefit of those discussed.    6. Obesity with current BMI of 39.5 Beth Morgan is currently in the action stage of change. As such, her goal is to continue with weight loss efforts. She has agreed to the Category 1 Plan with breakfast options.   Exercise goals:  As is  Behavioral modification strategies: decreasing simple carbohydrates, keeping healthy foods in the home, avoiding temptations, and planning for success.  Beth Morgan has agreed to follow-up with our clinic in 2 weeks. She was informed of the importance of frequent follow-up visits to maximize her success with intensive lifestyle modifications for her multiple health conditions.   Objective:   Blood pressure 138/84, pulse 83, temperature 98.3 F (36.8 C), height 5\' 4"  (1.626 m), weight 230 lb (104.3 kg), SpO2 98 %. Body mass index is 39.48 kg/m.  General: Cooperative, alert, well developed, in no acute distress. HEENT: Conjunctivae and lids unremarkable. Cardiovascular: Regular rhythm.  Lungs: Normal work  of breathing. Neurologic: No focal deficits.   Lab Results  Component Value Date   CREATININE 0.84 04/19/2021   BUN 8 04/19/2021   NA 141 04/19/2021   K 4.2 04/19/2021   CL 104 04/19/2021   CO2 24 04/19/2021   Lab Results  Component Value Date   ALT 18 04/19/2021   AST 22 04/19/2021   ALKPHOS 104 04/19/2021   BILITOT 0.3 04/19/2021   Lab Results  Component Value Date   HGBA1C 5.7 (H) 04/19/2021   HGBA1C 5.6 10/26/2012   Lab Results  Component Value Date   INSULIN 17.0 04/19/2021   Lab Results  Component Value Date   TSH 2.730 04/19/2021   Lab Results  Component Value Date   CHOL 228 (H) 04/19/2021   HDL 52 04/19/2021   LDLCALC 158 (H) 04/19/2021   TRIG 101 04/19/2021   Lab Results  Component Value Date   VD25OH 5.9 (L) 04/19/2021   Lab Results  Component Value Date   WBC 6.9 04/19/2021   HGB 12.3 04/19/2021   HCT 39.1 04/19/2021  MCV 83 04/19/2021   PLT 275 04/19/2021    Attestation Statements:   Reviewed by clinician on day of visit: allergies, medications, problem list, medical history, surgical history, family history, social history, and previous encounter notes.  Beth Morgan, Beth Morgan, CMA, am acting as transcriptionist for Marsh & McLennanDeborah Sevan Mcbroom, DO.  I have reviewed the above documentation for accuracy and completeness, and I agree with the above. Beth Morgan, D.O.  The 21st Century Cures Act was signed into law in 2016 which includes the topic of electronic health records.  This provides immediate access to information in MyChart.  This includes consultation notes, operative notes, office notes, lab results and pathology reports.  If you have any questions about what you read please let us know at your next visit so we can discuss your concerns and take corrective action if need be.  We are right here with you.

## 2021-05-16 ENCOUNTER — Other Ambulatory Visit: Payer: Self-pay

## 2021-05-16 ENCOUNTER — Ambulatory Visit (INDEPENDENT_AMBULATORY_CARE_PROVIDER_SITE_OTHER): Payer: Managed Care, Other (non HMO) | Admitting: Physician Assistant

## 2021-05-16 ENCOUNTER — Encounter (INDEPENDENT_AMBULATORY_CARE_PROVIDER_SITE_OTHER): Payer: Self-pay | Admitting: Physician Assistant

## 2021-05-16 VITALS — BP 144/87 | HR 74 | Temp 98.2°F | Ht 64.0 in | Wt 229.0 lb

## 2021-05-16 DIAGNOSIS — Z6839 Body mass index (BMI) 39.0-39.9, adult: Secondary | ICD-10-CM

## 2021-05-16 DIAGNOSIS — E669 Obesity, unspecified: Secondary | ICD-10-CM | POA: Diagnosis not present

## 2021-05-16 DIAGNOSIS — R7303 Prediabetes: Secondary | ICD-10-CM

## 2021-05-16 NOTE — Progress Notes (Signed)
Chief Complaint:   OBESITY Beth Morgan is here to discuss her progress with her obesity treatment plan along with follow-up of her obesity related diagnoses. Beth Morgan is on the Category 1 Plan with breakfast options and states she is following her eating plan approximately 70% of the time. Beth Morgan states she is doing 0 minutes 0 times per week.  Today's visit was #: 3 Starting weight: 229 lbs Starting date: 04/19/2021 Today's weight: 229 lbs Today's date: 05/16/2021 Total lbs lost to date: 0 Total lbs lost since last in-office visit: 1  Interim History: Beth Morgan has been following the plan for 1 week since going grocery shopping. She has cut down on going to the vending machine. She is not hungry for dinner when she gets home. She is not weighing her protein.  Subjective:   1. Pre-diabetes Beth Morgan's last A1c was 5.7, and she is not on medications. She denies polyphagia.  Assessment/Plan:   1. Pre-diabetes Beth Morgan will continue her meal plan, and will continue to work on weight loss, exercise, and decreasing simple carbohydrates to help decrease the risk of diabetes.   2. Obesity with current BMI of 39.29 Beth Morgan is currently in the action stage of change. As such, her goal is to continue with weight loss efforts. She has agreed to the Category 1 Plan.   Beth Morgan will consider eating dinner portion for lunch, and lunch portion for dinner.  Exercise goals: No exercise has been prescribed at this time.  Behavioral modification strategies: no skipping meals and meal planning and cooking strategies.  Beth Morgan has agreed to follow-up with our clinic in 2 weeks. She was informed of the importance of frequent follow-up visits to maximize her success with intensive lifestyle modifications for her multiple health conditions.   Objective:   Blood pressure (!) 144/87, pulse 74, temperature 98.2 F (36.8 C), height 5\' 4"  (1.626 m), weight 229 lb (103.9 kg), SpO2 100 %. Body mass index is 39.31  kg/m.  General: Cooperative, alert, well developed, in no acute distress. HEENT: Conjunctivae and lids unremarkable. Cardiovascular: Regular rhythm.  Lungs: Normal work of breathing. Neurologic: No focal deficits.   Lab Results  Component Value Date   CREATININE 0.84 04/19/2021   BUN 8 04/19/2021   NA 141 04/19/2021   K 4.2 04/19/2021   CL 104 04/19/2021   CO2 24 04/19/2021   Lab Results  Component Value Date   ALT 18 04/19/2021   AST 22 04/19/2021   ALKPHOS 104 04/19/2021   BILITOT 0.3 04/19/2021   Lab Results  Component Value Date   HGBA1C 5.7 (H) 04/19/2021   HGBA1C 5.6 10/26/2012   Lab Results  Component Value Date   INSULIN 17.0 04/19/2021   Lab Results  Component Value Date   TSH 2.730 04/19/2021   Lab Results  Component Value Date   CHOL 228 (H) 04/19/2021   HDL 52 04/19/2021   LDLCALC 158 (H) 04/19/2021   TRIG 101 04/19/2021   Lab Results  Component Value Date   VD25OH 5.9 (L) 04/19/2021   Lab Results  Component Value Date   WBC 6.9 04/19/2021   HGB 12.3 04/19/2021   HCT 39.1 04/19/2021   MCV 83 04/19/2021   PLT 275 04/19/2021   No results found for: IRON, TIBC, FERRITIN  Attestation Statements:   Reviewed by clinician on day of visit: allergies, medications, problem list, medical history, surgical history, family history, social history, and previous encounter notes.  Time spent on visit including pre-visit chart review and  post-visit care and charting was 30 minutes.    Wilhemena Durie, am acting as transcriptionist for [employer], PA-C.  I have reviewed the above documentation for accuracy and completeness, and I agree with the above. Abby Potash, PA-C

## 2021-05-21 ENCOUNTER — Other Ambulatory Visit (INDEPENDENT_AMBULATORY_CARE_PROVIDER_SITE_OTHER): Payer: Self-pay | Admitting: Family Medicine

## 2021-05-21 DIAGNOSIS — E559 Vitamin D deficiency, unspecified: Secondary | ICD-10-CM

## 2021-06-02 ENCOUNTER — Ambulatory Visit (INDEPENDENT_AMBULATORY_CARE_PROVIDER_SITE_OTHER): Payer: Managed Care, Other (non HMO) | Admitting: Family Medicine

## 2021-11-16 ENCOUNTER — Encounter (INDEPENDENT_AMBULATORY_CARE_PROVIDER_SITE_OTHER): Payer: Self-pay

## 2024-02-23 ENCOUNTER — Ambulatory Visit: Payer: Self-pay | Admitting: Family Medicine
